# Patient Record
Sex: Female | Born: 2000 | Race: Black or African American | Hispanic: No | Marital: Single | Smoking: Never smoker
Health system: Southern US, Community
[De-identification: ages and names within clinical notes are randomized; demographics above are authoritative.]

## PROBLEM LIST (undated history)

## (undated) DIAGNOSIS — L309 Dermatitis, unspecified: Secondary | ICD-10-CM

## (undated) HISTORY — DX: Dermatitis, unspecified: L30.9

---
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
